# Patient Record
Sex: Male | Born: 1990 | Race: Black or African American | Hispanic: No | Marital: Single
Health system: Southern US, Community
[De-identification: ages and names within clinical notes are randomized; demographics above are authoritative.]

---

## 2018-05-27 ENCOUNTER — Emergency Department (HOSPITAL_COMMUNITY): Payer: Self-pay

## 2018-05-27 ENCOUNTER — Emergency Department (HOSPITAL_COMMUNITY)
Admission: EM | Admit: 2018-05-27 | Discharge: 2018-05-27 | Disposition: A | Discharge status: Home / Self Care | Payer: Self-pay | Attending: Emergency Medicine | Admitting: Emergency Medicine

## 2018-05-27 ENCOUNTER — Encounter (HOSPITAL_COMMUNITY): Payer: Self-pay | Admitting: Emergency Medicine

## 2018-05-27 ENCOUNTER — Other Ambulatory Visit: Payer: Self-pay

## 2018-05-27 DIAGNOSIS — M25531 Pain in right wrist: Secondary | ICD-10-CM

## 2018-05-27 DIAGNOSIS — S6992XA Unspecified injury of left wrist, hand and finger(s), initial encounter: Secondary | ICD-10-CM

## 2018-05-27 DIAGNOSIS — Y929 Unspecified place or not applicable: Secondary | ICD-10-CM | POA: Insufficient documentation

## 2018-05-27 DIAGNOSIS — M25561 Pain in right knee: Secondary | ICD-10-CM

## 2018-05-27 DIAGNOSIS — Y998 Other external cause status: Secondary | ICD-10-CM | POA: Insufficient documentation

## 2018-05-27 DIAGNOSIS — Y9389 Activity, other specified: Secondary | ICD-10-CM | POA: Insufficient documentation

## 2018-05-27 DIAGNOSIS — W298XXA Contact with other powered powered hand tools and household machinery, initial encounter: Secondary | ICD-10-CM | POA: Insufficient documentation

## 2018-05-27 DIAGNOSIS — R609 Edema, unspecified: Secondary | ICD-10-CM | POA: Insufficient documentation

## 2018-05-27 MED ORDER — IBUPROFEN 200 MG PO TABS
600.0000 mg | ORAL_TABLET | Freq: Once | ORAL | Status: AC
  Administered 2018-05-27: 600 mg via ORAL
  Filled 2018-05-27: qty 3

## 2018-05-27 NOTE — ED Provider Notes (Signed)
[facility]-EMERGENCY DEPT Provider Note   CSN: [id number] Arrival date & time: 05/27/18  9147     History   Chief Complaint Chief Complaint  Patient presents with  . Knee Pain  . Hand Pain    HPI Bruce Collier is a 27 y.o. male.  HPI   Bruce Collier is a 27 y.o. male, patient with no pertinent past medical history, presenting to the ED with extremity injuries that occurred shortly prior to arrival.  States he was using a hand drill, the drill slipped from his hand, and cause pain to the left ring finger, right wrist, and right knee.  Pain is worst in the left ring finger, throbbing, severe, nonradiating.  Pain is moderate in the right wrist and right knee, described as a soreness, nonradiating.  Denies numbness, weakness, wounds, or any other complaints.     History reviewed. No pertinent past medical history.  There are no active problems to display for this patient.   History reviewed. No pertinent surgical history.      Home Medications    Prior to Admission medications   Medication Sig Start Date End Date Taking? Authorizing Provider  naproxen sodium (ALEVE) 220 MG tablet Take 440 mg by mouth daily as needed (pain).   Yes [provider]    Family History No family history on file.  Social History Social History   Tobacco Use  . Smoking status: Not on file  Substance Use Topics  . Alcohol use: Not on file  . Drug use: Not on file     Allergies   Patient has no known allergies.   Review of Systems Review of Systems  Musculoskeletal: Positive for arthralgias.  Neurological: Negative for weakness and numbness.     Physical Exam Updated Vital Signs BP 102/66 (BP Location: Left Arm)   Pulse 76   Temp 98 F (36.7 C)   Resp 16   SpO2 98%   Physical Exam  Constitutional: He appears well-developed and well-nourished. No distress.  HENT:  Head: Normocephalic and atraumatic.  Eyes: Conjunctivae are normal.  Neck:  Neck supple.  Cardiovascular: Normal rate, regular rhythm and intact distal pulses.  Pulmonary/Chest: Effort normal.  Musculoskeletal: He exhibits edema and tenderness.  Tenderness along the length of the left ring finger, accompanied by swelling.  Flexion and extension intact, but full range of motion limited by pain and swelling. Tenderness to the medial right knee and dorsal right wrist without swelling, erythema, bruising, deformity, crepitus, or instability.   Neurological: He is alert.  Sensation grossly intact to light touch in each of the extremities. Strength 5/5 in each of the extremities. Ambulatory without assistance   Skin: Skin is warm and dry. Capillary refill takes less than 2 seconds. He is not diaphoretic. No pallor.  Psychiatric: He has a normal mood and affect. His behavior is normal.  Nursing note and vitals reviewed.    ED Treatments / Results  Labs (all labs ordered are listed, but only abnormal results are displayed) Labs Reviewed - No data to display  EKG None  Radiology Dg Wrist Complete Right  Result Date: 05/27/2018 CLINICAL DATA:  Generalized right wrist pain and swelling. Injury using a jackhammer. Initial encounter. EXAM: RIGHT WRIST - COMPLETE 3+ VIEW COMPARISON:  None. FINDINGS: There is no evidence of fracture or dislocation. There is no evidence of arthropathy or other focal bone abnormality. Soft tissues are unremarkable. IMPRESSION: Negative. Electronically Signed   By: Jolaine Click.D.  On: 05/27/2018 08:49   Dg Knee Complete 4 Views Right  Result Date: 05/27/2018 CLINICAL DATA:  Jackhammer injury.  Hand pain. EXAM: RIGHT KNEE - COMPLETE 4+ VIEW COMPARISON:  No prior. FINDINGS: No acute bony or joint abnormality identified. No evidence of fracture or dislocation. No focal abnormality identified. IMPRESSION: No acute or focal abnormality. Electronically Signed   By: Maisie Fushomas  Register   On: 05/27/2018 08:51   Dg Hand Complete Left  Result  Date: 05/27/2018 CLINICAL DATA:  Injury using jackhammer. EXAM: LEFT HAND - COMPLETE 3+ VIEW COMPARISON:  None. FINDINGS: There is no evidence of fracture or dislocation. There is no evidence of arthropathy or other focal bone abnormality. Soft tissues are unremarkable. IMPRESSION: Negative. Electronically Signed   By: Elsie StainJohn T Curnes M.D.   On: 05/27/2018 08:47    Procedures Procedures (including critical care time)  Medications Ordered in ED Medications  ibuprofen (ADVIL,MOTRIN) tablet 600 mg (has no administration in time range)     Initial Impression / Assessment and Plan / ED Course  I have reviewed the triage vital signs and the nursing notes.  Pertinent labs & imaging results that were available during my care of the patient were reviewed by me and considered in my medical decision making (see chart for details).     Patient presents with injuries to the right wrist, right knee, and left ring finger.  No noted neurologic deficits.  No acute abnormalities on x-ray.  PCP versus orthopedic follow-up. The patient was given instructions for home care as well as return precautions. Patient voices understanding of these instructions, accepts the plan, and is comfortable with discharge.  Final Clinical Impressions(s) / ED Diagnoses   Final diagnoses:  Hand injury, left, initial encounter  Acute pain of right knee  Right wrist pain    ED Discharge Orders    None       Concepcion LivingJoy, Jayliani Wanner C, PA-C 05/27/18 0857    Shaune PollackIsaacs, Cameron, MD 05/27/18 1625

## 2018-05-27 NOTE — Discharge Instructions (Signed)
You have been seen today for injuries to the wrist, hand, and knee. There were no acute abnormalities on the x-rays, including no sign of fracture or dislocation, however, there could be injuries to the soft tissues, such as the ligaments or tendons that are not seen on xrays. There could also be what are called occult fractures that are small fractures not seen on xray. Pain: Take 600 mg of ibuprofen every 6 hours or 440 mg (over the counter dose) to 500 mg (prescription dose) of naproxen every 12 hours for the next 3 days. After this time, these medications may be used as needed for pain. Take these medications with food to avoid upset stomach. Choose only one of these medications, do not take them together.  Tylenol: Should you continue to have additional pain while taking the ibuprofen or naproxen, you may add in tylenol as needed. Your daily total maximum amount of tylenol from all sources should be limited to 4000mg /day for persons without liver problems, or 2000mg /day for those with liver problems. Ice: May apply ice to the area over the next 24 hours for 15 minutes at a time to reduce swelling. Elevation: Keep the extremity elevated as often as possible to reduce pain and inflammation. Exercises: Start by performing these exercises a few times a week, increasing the frequency until you are performing them twice daily.  Follow up: If symptoms are improving, you may follow up with your primary care provider for any continued management. If symptoms are not improving, you may follow up with the orthopedic specialist.

## 2018-05-27 NOTE — ED Triage Notes (Signed)
Per Pt: Pt reports he was doing some "side work" and using one of the power tools on the wall and it came out and hit his left hand, right wrist, and right knee.  Pt ambulatory in triage and able to move all extremities at this time.  Swelling noted to left hand

## 2019-12-09 IMAGING — CR DG KNEE COMPLETE 4+V*R*
4 series · 4 of 4 positions shown · non-contrast
Comparison: No prior.

CLINICAL DATA: Jackhammer injury.  Hand pain.

EXAM:
RIGHT KNEE - COMPLETE 4+ VIEW

[t knee ap right]
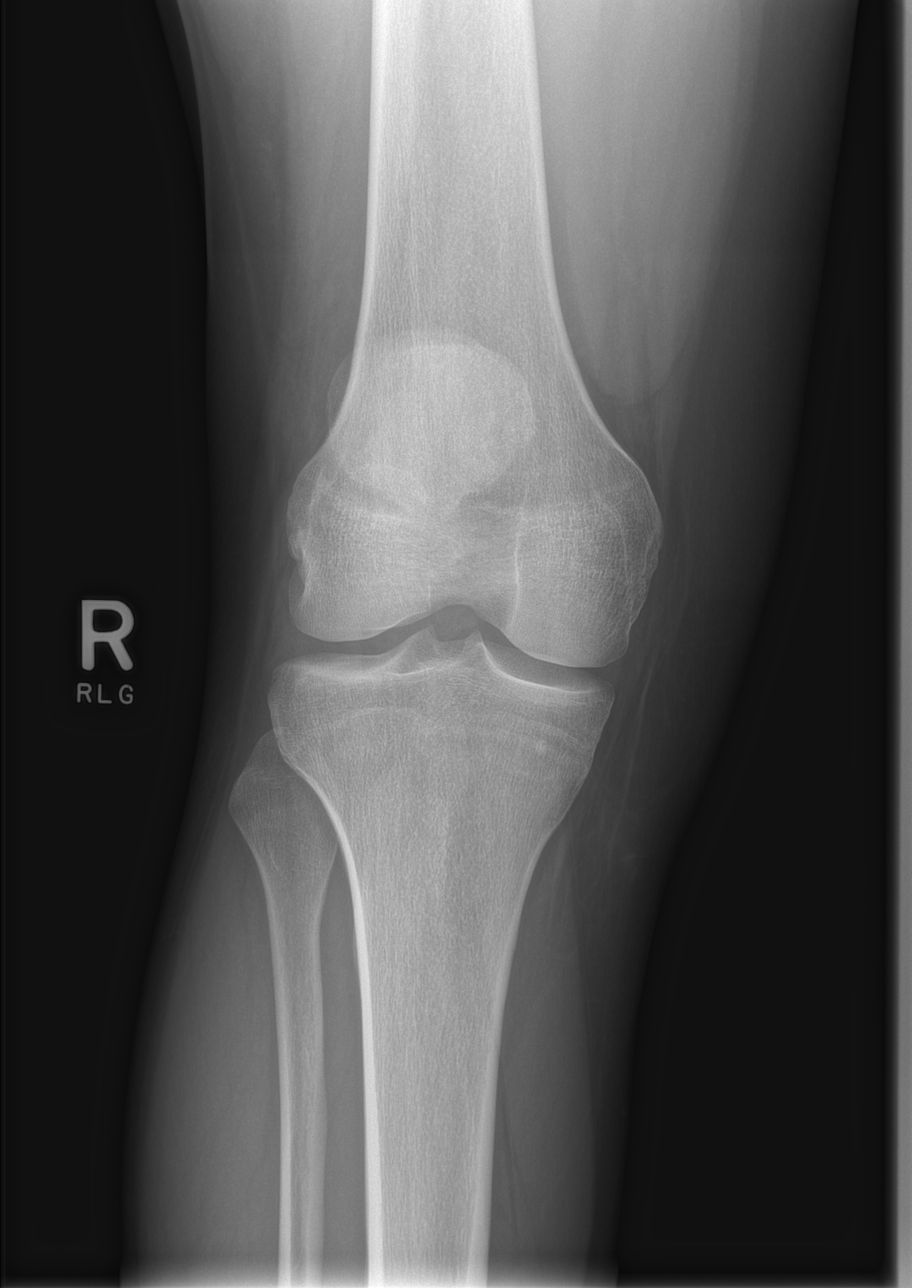

[t knee obl right (1 of 2)]
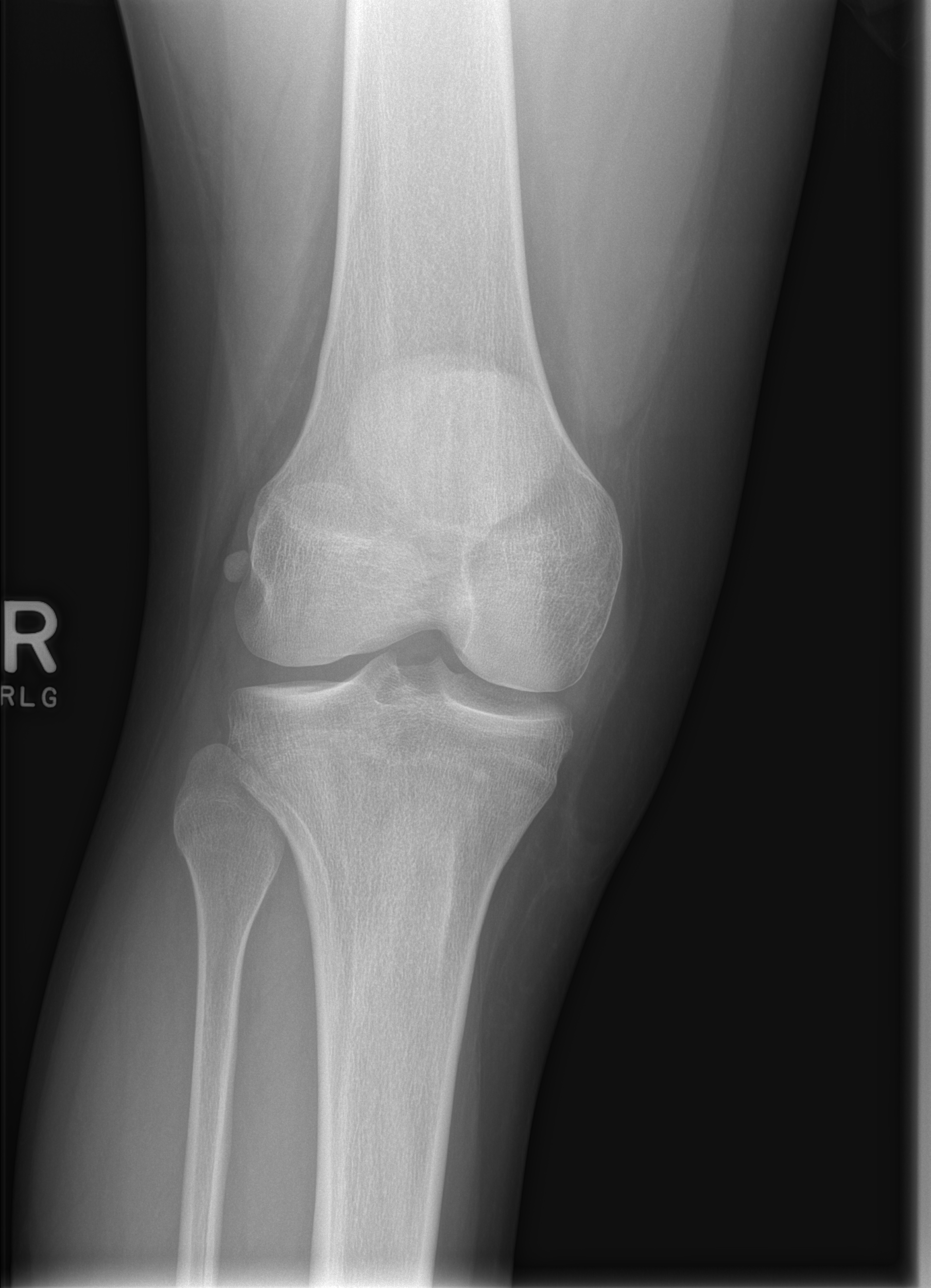

[t knee obl right (2 of 2)]
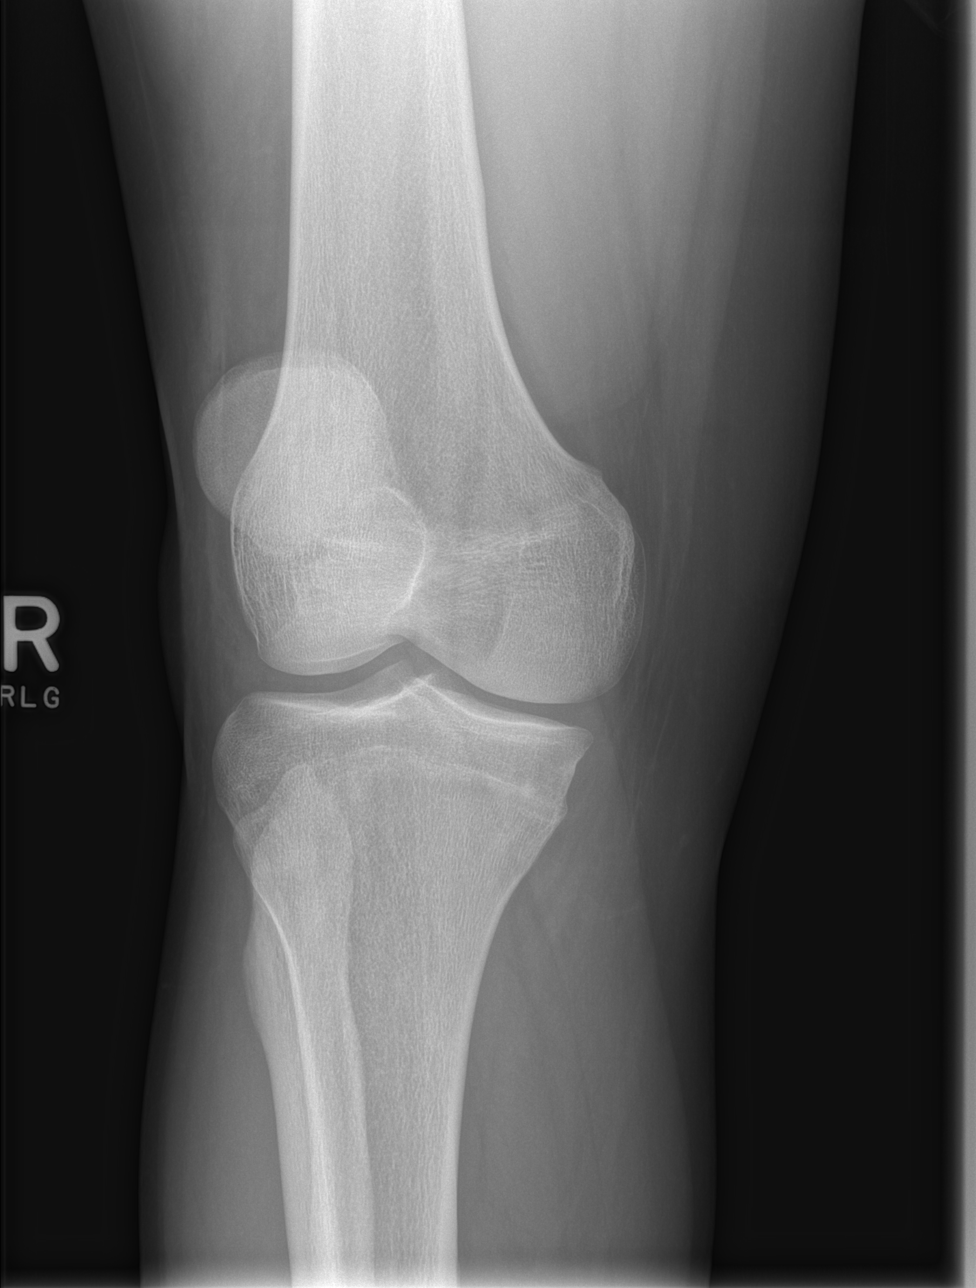

[t knee lat right]
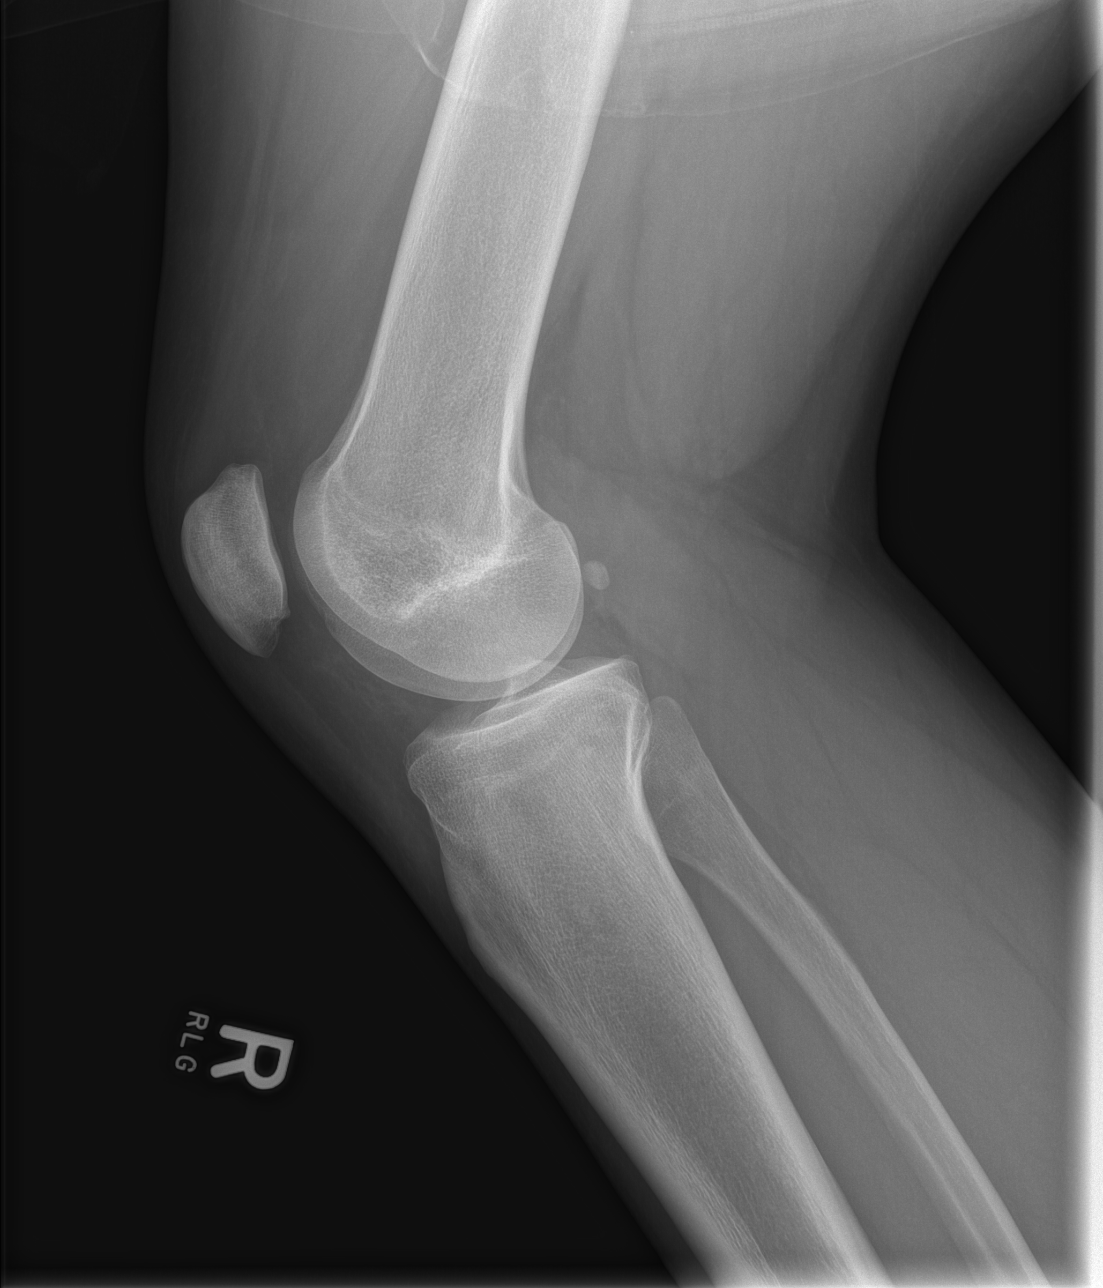

[4 of 4 positions shown; findings below may reference images not displayed]

FINDINGS: No acute bony or joint abnormality identified. No evidence of
fracture or dislocation. No focal abnormality identified.
IMPRESSION: No acute or focal abnormality.
